# Patient Record
Sex: Male | Born: 1990 | Race: Black or African American | Hispanic: No | Marital: Single | Smoking: Current every day smoker
Health system: Southern US, Community
[De-identification: ages and names within clinical notes are randomized; demographics above are authoritative.]

## PROBLEM LIST (undated history)

## (undated) HISTORY — PX: APPENDECTOMY: SHX54

---

## 2013-11-07 ENCOUNTER — Encounter (HOSPITAL_COMMUNITY): Payer: Self-pay | Admitting: Emergency Medicine

## 2013-11-07 ENCOUNTER — Emergency Department (HOSPITAL_COMMUNITY)
Admission: EM | Admit: 2013-11-07 | Discharge: 2013-11-08 | Disposition: A | Discharge status: Home / Self Care | Payer: Self-pay | Attending: Emergency Medicine | Admitting: Emergency Medicine

## 2013-11-07 ENCOUNTER — Emergency Department (HOSPITAL_COMMUNITY): Payer: Self-pay

## 2013-11-07 DIAGNOSIS — M549 Dorsalgia, unspecified: Secondary | ICD-10-CM | POA: Insufficient documentation

## 2013-11-07 DIAGNOSIS — F17200 Nicotine dependence, unspecified, uncomplicated: Secondary | ICD-10-CM | POA: Insufficient documentation

## 2013-11-07 DIAGNOSIS — R0789 Other chest pain: Secondary | ICD-10-CM | POA: Insufficient documentation

## 2013-11-07 LAB — I-STAT TROPONIN, ED: Troponin i, poc: 0 ng/mL (ref 0.00–0.08)

## 2013-11-07 LAB — CBC
HCT: 39.8 % (ref 39.0–52.0)
HEMOGLOBIN: 13.4 g/dL (ref 13.0–17.0)
MCH: 29.9 pg (ref 26.0–34.0)
MCHC: 33.7 g/dL (ref 30.0–36.0)
MCV: 88.8 fL (ref 78.0–100.0)
PLATELETS: 211 10*3/uL (ref 150–400)
RBC: 4.48 MIL/uL (ref 4.22–5.81)
RDW: 12.8 % (ref 11.5–15.5)
WBC: 11.5 10*3/uL — ABNORMAL HIGH (ref 4.0–10.5)

## 2013-11-07 LAB — BASIC METABOLIC PANEL
BUN: 23 mg/dL (ref 6–23)
CALCIUM: 9.8 mg/dL (ref 8.4–10.5)
CHLORIDE: 104 meq/L (ref 96–112)
CO2: 21 meq/L (ref 19–32)
Creatinine, Ser: 0.82 mg/dL (ref 0.50–1.35)
GFR calc Af Amer: >90 mL/min (ref >90)
GFR calc non Af Amer: >90 mL/min (ref >90)
GLUCOSE: 111 mg/dL — AB (ref 70–99)
POTASSIUM: 3.7 meq/L (ref 3.7–5.3)
Sodium: 138 mEq/L (ref 137–147)

## 2013-11-07 NOTE — ED Provider Notes (Signed)
CSN: [id number]     Arrival date & time 11/07/13  2218 History   First MD Initiated Contact with Patient 11/07/13 2257     Chief Complaint  Patient presents with  . Chest Pain   HPI  History provided by the patient. The patient is a 23 year old male with history of appendectomy and every day smoker who presents with complaints of central chest pain and back pain. [facility] and while he was there was resting talking and suddenly began to have sharp central chest pain occasionally radiating around the shoulders to the back. Pain was most severe for about 10-15 minutes and has eased off since then without any treatment. He does report some return of pain and discomfort when he takes a deep breath or raises his arms up. Patient has been active with no recent long travel. No prior history of DVT or PE. No recent URI symptoms, cough or hemoptysis. He denies any other drug use or alcohol use. No traumas or injuries. No prior history of similar symptoms.   History reviewed. No pertinent past medical history. Past Surgical History  Procedure Laterality Date  . Appendectomy     History reviewed. No pertinent family history. History  Substance Use Topics  . Smoking status: Current Every Day Smoker  . Smokeless tobacco: Never Used  . Alcohol Use: No    Review of Systems  Constitutional: Negative for fever, chills and diaphoresis.  Respiratory: Negative for cough and shortness of breath.   Cardiovascular: Positive for chest pain. Negative for palpitations.  Gastrointestinal: Negative for nausea and vomiting.  All other systems reviewed and are negative.     Allergies  Review of patient's allergies indicates no known allergies.  Home Medications   Prior to Admission medications   Not on File   BP 122/72  Pulse 76  Temp(Src) 97.7 F (36.5 C) (Oral)  Resp 15  SpO2 98% Physical Exam  Nursing note and vitals reviewed. Constitutional: He  appears well-developed and well-nourished. No distress.  HENT:  Head: Normocephalic.  Cardiovascular: Normal rate and regular rhythm.   No murmur heard. Pulmonary/Chest: Effort normal and breath sounds normal. No respiratory distress. He has no wheezes. He has no rales. He exhibits tenderness.    There is some reproducible tenderness primarily to the left sternal edge and pectoralis area. No masses swelling or deformities. This.  Abdominal: Soft. There is no tenderness. There is no rebound and no guarding.  Skin: Skin is warm.  Psychiatric: He has a normal mood and affect. His behavior is normal.    ED Course  Procedures   COORDINATION OF CARE:  Nursing notes reviewed. Vital signs reviewed. Initial pt interview and examination performed.   Filed Vitals:   11/07/13 2220  BP: 122/72  Pulse: 76  Temp: 97.7 F (36.5 C)  TempSrc: Oral  Resp: 15  SpO2: 98%    11:06 PM-patient seen and evaluated. Patient sitting appears comfortable no acute distress. Afebrile. Normal heart rate. Normal respirations and O2 sats. Patient is PERC negative. No significant history of risk factors for ACS.  Patient with unremarkable laboratory testing and chest x-ray. Normal EKG. At this time do not suspect any concerning or emergent condition.  The patient appears reasonably screened and/or stabilized for discharge and I doubt any other medical condition or other [facility] requiring further screening, evaluation, or treatment in the ED at this time prior to discharge.     Treatment plan initiated: Medications  naproxen (NAPROSYN)  tablet 500 mg (not administered)    Results for orders placed during the hospital encounter of 11/07/13  CBC      Result Value Ref Range   WBC 11.5 (*) 4.0 - 10.5 K/uL   RBC 4.48  4.22 - 5.81 MIL/uL   Hemoglobin 13.4  13.0 - 17.0 g/dL   HCT 45.439.8  09.839.0 - 11.952.0 %   MCV 88.8  78.0 - 100.0 fL   MCH 29.9  26.0 - 34.0 pg   MCHC 33.7  30.0 - 36.0 g/dL   RDW 14.712.8  82.911.5 - 56.215.5 %     Platelets 211  150 - 400 K/uL  BASIC METABOLIC PANEL      Result Value Ref Range   Sodium 138  137 - 147 mEq/L   Potassium 3.7  3.7 - 5.3 mEq/L   Chloride 104  96 - 112 mEq/L   CO2 21  19 - 32 mEq/L   Glucose, Bld 111 (*) 70 - 99 mg/dL   BUN 23  6 - 23 mg/dL   Creatinine, Ser 1.300.82  0.50 - 1.35 mg/dL   Calcium 9.8  8.4 - 86.510.5 mg/dL   GFR calc non Af Amer >90  >90 mL/min   GFR calc Af Amer >90  >90 mL/min  I-STAT TROPOININ, ED      Result Value Ref Range   Troponin i, poc 0.00  0.00 - 0.08 ng/mL   Comment 3               Imaging Review Dg Chest Port 1 View  11/07/2013   CLINICAL DATA:  Chest pain.  Back pain.  Short of breath.  EXAM: PORTABLE CHEST - 1 VIEW  COMPARISON:  None.  FINDINGS: Cardiopericardial silhouette within normal limits. Mediastinal contours normal. Trachea midline. No airspace disease or effusion. Monitoring leads project over the chest.  IMPRESSION: No active disease.   Electronically Signed   By: Andreas NewportGeoffrey  Lamke M.D.   On: 11/07/2013 23:01     EKG Interpretation None      Date: 11/08/2013  Rate: 78  Rhythm: normal sinus rhythm  QRS Axis: normal  Intervals: normal  ST/T Wave abnormalities: normal  Conduction Disutrbances:none  Narrative Interpretation:   Old EKG Reviewed: none available    MDM   Final diagnoses:  Musculoskeletal chest pain        Angus Sellereter S Dammen, PA-C 11/08/13 0011

## 2013-11-07 NOTE — ED Notes (Signed)
Bed: WA20 Expected date:  Expected time:  Means of arrival:  Comments: EMS back and chest wall pain

## 2013-11-07 NOTE — ED Notes (Signed)
Xray bedside.

## 2013-11-07 NOTE — ED Notes (Signed)
[facility].  Pt reports that he has been having sharp pain to chest and back 10-15 minutes ago; pt reports increased pain with movement of arms and with breathing.  Pt presents to ED ambulatory, skin warm and dry, no s/s acute distress noted, no s/s respiratory distress.

## 2013-11-08 MED ORDER — NAPROXEN 500 MG PO TABS: 500.0000 mg | ORAL_TABLET | Freq: Two times a day (BID) | ORAL | Status: DC

## 2013-11-08 MED ORDER — NAPROXEN 500 MG PO TABS
500.0000 mg | ORAL_TABLET | Freq: Once | ORAL | Status: AC
  Administered 2013-11-08: 500 mg via ORAL
  Filled 2013-11-08: qty 1

## 2013-11-08 NOTE — ED Provider Notes (Signed)
Medical screening examination/treatment/procedure(s) were performed by non-physician practitioner and as supervising physician I was immediately available for consultation/collaboration.   EKG Interpretation None        Trichelle Lehan, MD 11/08/13 0337 

## 2013-11-08 NOTE — Discharge Instructions (Signed)
You were seen and evaluated for your chest pains. At this time your providers do not feel your chest pains are caused by any emergent condition. Please use the medication prescribed to help with pain and soreness. Followup with a primary care provider. Return anytime for change or worsening symptoms.    Chest Wall Pain Chest wall pain is pain in or around the bones and muscles of your chest. It may take up to 6 weeks to get better. It may take longer if you must stay physically active in your work and activities.  CAUSES  Chest wall pain may happen on its own. However, it may be caused by:  A viral illness like the flu.  Injury.  Coughing.  Exercise.  Arthritis.  Fibromyalgia.  Shingles. HOME CARE INSTRUCTIONS   Avoid overtiring physical activity. Try not to strain or perform activities that cause pain. This includes any activities using your chest or your abdominal and side muscles, especially if heavy weights are used.  Put ice on the sore area.  Put ice in a plastic bag.  Place a towel between your skin and the bag.  Leave the ice on for 15-20 minutes per hour while awake for the first 2 days.  Only take over-the-counter or prescription medicines for pain, discomfort, or fever as directed by your caregiver. SEEK IMMEDIATE MEDICAL CARE IF:   Your pain increases, or you are very uncomfortable.  You have a fever.  Your chest pain becomes worse.  You have new, unexplained symptoms.  You have nausea or vomiting.  You feel sweaty or lightheaded.  You have a cough with phlegm (sputum), or you cough up blood. MAKE SURE YOU:   Understand these instructions.  Will watch your condition.  Will get help right away if you are not doing well or get worse. Document Released: 05/11/2005 Document Revised: 08/03/2011 Document Reviewed: 01/05/2011 [facility] Patient Information 2015 [place], [place]. This information is not intended to replace advice given to you by your health  care provider. Make sure you discuss any questions you have with your health care provider.

## 2013-11-16 ENCOUNTER — Ambulatory Visit (HOSPITAL_COMMUNITY)
Admission: RE | Admit: 2013-11-16 | Discharge: 2013-11-16 | Disposition: A | Discharge status: Home / Self Care | Payer: Self-pay | Source: Ambulatory Visit | Attending: Family Medicine | Admitting: Family Medicine

## 2013-11-16 ENCOUNTER — Other Ambulatory Visit (HOSPITAL_COMMUNITY): Payer: Self-pay | Admitting: Family Medicine

## 2013-11-16 DIAGNOSIS — R05 Cough: Secondary | ICD-10-CM | POA: Insufficient documentation

## 2013-11-16 DIAGNOSIS — M795 Residual foreign body in soft tissue: Secondary | ICD-10-CM | POA: Insufficient documentation

## 2013-11-16 DIAGNOSIS — R059 Cough, unspecified: Secondary | ICD-10-CM | POA: Insufficient documentation

## 2013-12-19 ENCOUNTER — Emergency Department (HOSPITAL_COMMUNITY): Payer: Self-pay

## 2013-12-19 ENCOUNTER — Emergency Department (HOSPITAL_COMMUNITY)
Admission: EM | Admit: 2013-12-19 | Discharge: 2013-12-19 | Disposition: A | Discharge status: Home / Self Care | Payer: Self-pay | Attending: Emergency Medicine | Admitting: Emergency Medicine

## 2013-12-19 ENCOUNTER — Encounter (HOSPITAL_COMMUNITY): Payer: Self-pay | Admitting: Emergency Medicine

## 2013-12-19 DIAGNOSIS — R079 Chest pain, unspecified: Secondary | ICD-10-CM | POA: Insufficient documentation

## 2013-12-19 DIAGNOSIS — J40 Bronchitis, not specified as acute or chronic: Secondary | ICD-10-CM

## 2013-12-19 DIAGNOSIS — IMO0002 Reserved for concepts with insufficient information to code with codable children: Secondary | ICD-10-CM | POA: Insufficient documentation

## 2013-12-19 DIAGNOSIS — F17200 Nicotine dependence, unspecified, uncomplicated: Secondary | ICD-10-CM | POA: Insufficient documentation

## 2013-12-19 DIAGNOSIS — Z720 Tobacco use: Secondary | ICD-10-CM

## 2013-12-19 DIAGNOSIS — Z79899 Other long term (current) drug therapy: Secondary | ICD-10-CM | POA: Insufficient documentation

## 2013-12-19 LAB — CBC
HEMATOCRIT: 43.2 % (ref 39.0–52.0)
Hemoglobin: 14.1 g/dL (ref 13.0–17.0)
MCH: 29.6 pg (ref 26.0–34.0)
MCHC: 32.6 g/dL (ref 30.0–36.0)
MCV: 90.6 fL (ref 78.0–100.0)
Platelets: 204 10*3/uL (ref 150–400)
RBC: 4.77 MIL/uL (ref 4.22–5.81)
RDW: 13.1 % (ref 11.5–15.5)
WBC: 7.6 10*3/uL (ref 4.0–10.5)

## 2013-12-19 LAB — I-STAT TROPONIN, ED: TROPONIN I, POC: 0 ng/mL (ref 0.00–0.08)

## 2013-12-19 LAB — BASIC METABOLIC PANEL
ANION GAP: 14 (ref 5–15)
BUN: 18 mg/dL (ref 6–23)
CO2: 21 mEq/L (ref 19–32)
Calcium: 9.6 mg/dL (ref 8.4–10.5)
Chloride: 101 mEq/L (ref 96–112)
Creatinine, Ser: 0.8 mg/dL (ref 0.50–1.35)
GFR calc Af Amer: >90 mL/min (ref >90)
Glucose, Bld: 95 mg/dL (ref 70–99)
POTASSIUM: 4 meq/L (ref 3.7–5.3)
Sodium: 136 mEq/L — ABNORMAL LOW (ref 137–147)

## 2013-12-19 LAB — PRO B NATRIURETIC PEPTIDE: PRO B NATRI PEPTIDE: 7.4 pg/mL (ref 0–125)

## 2013-12-19 MED ORDER — AEROCHAMBER Z-STAT PLUS/MEDIUM MISC
1.0000 | Freq: Once | Status: AC
  Administered 2013-12-19: 1
  Filled 2013-12-19: qty 1

## 2013-12-19 MED ORDER — ALBUTEROL SULFATE HFA 108 (90 BASE) MCG/ACT IN AERS
2.0000 | INHALATION_SPRAY | RESPIRATORY_TRACT | Status: DC | PRN
  Administered 2013-12-19: 2 via RESPIRATORY_TRACT
  Filled 2013-12-19: qty 6.7

## 2013-12-19 MED ORDER — IPRATROPIUM-ALBUTEROL 0.5-2.5 (3) MG/3ML IN SOLN
3.0000 mL | Freq: Once | RESPIRATORY_TRACT | Status: AC
Start: 2013-12-19 — End: 2013-12-19
  Administered 2013-12-19: 3 mL via RESPIRATORY_TRACT
  Filled 2013-12-19: qty 3

## 2013-12-19 MED ORDER — PREDNISONE 20 MG PO TABS
60.0000 mg | ORAL_TABLET | Freq: Once | ORAL | Status: AC
  Administered 2013-12-19: 60 mg via ORAL
  Filled 2013-12-19: qty 3

## 2013-12-19 MED ORDER — PREDNISONE 20 MG PO TABS: 20.0000 mg | ORAL_TABLET | Freq: Two times a day (BID) | ORAL | Status: AC

## 2013-12-19 NOTE — Discharge Instructions (Signed)
Try to stop smoking. Use Tylenol for pain. Use the inhaler 2 puffs every 4 hours for trouble breathing or cough.    Smoking Hazards Smoking cigarettes is extremely bad for your health. Tobacco smoke has over 200 known poisons in it. It contains the poisonous gases nitrogen oxide and carbon monoxide. There are over 60 chemicals in tobacco smoke that cause cancer. Some of the chemicals found in cigarette smoke include:   Cyanide.   Benzene.   Formaldehyde.   Methanol (wood alcohol).   Acetylene (fuel used in welding torches).   Ammonia.  Even smoking lightly shortens your life expectancy by several years. You can greatly reduce the risk of medical problems for you and your family by stopping now. Smoking is the most preventable cause of death and disease in our society. Within days of quitting smoking, your circulation improves, you decrease the risk of having a heart attack, and your lung capacity improves. There may be some increased phlegm in the first few days after quitting, and it may take months for your lungs to clear up completely. Quitting for 10 years reduces your risk of developing lung cancer to almost that of a nonsmoker.  WHAT ARE THE RISKS OF SMOKING? Cigarette smokers have an increased risk of many serious medical problems, including:  Lung cancer.   Lung disease (such as pneumonia, bronchitis, and emphysema).   Heart attack and chest pain due to the heart not getting enough oxygen (angina).   Heart disease and peripheral blood vessel disease.   Hypertension.   Stroke.   Oral cancer (cancer of the lip, mouth, or voice box).   Bladder cancer.   Pancreatic cancer.   Cervical cancer.   Pregnancy complications, including premature birth.   Stillbirths and smaller newborn babies, birth defects, and genetic damage to sperm.   Early menopause.   Lower estrogen level for women.   Infertility.   Facial wrinkles.   Blindness.    Increased risk of broken bones (fractures).   Senile dementia.   Stomach ulcers and internal bleeding.   Delayed wound healing and increased risk of complications during surgery. Because of secondhand smoke exposure, children of smokers have an increased risk of the following:   Sudden infant death syndrome (SIDS).   Respiratory infections.   Lung cancer.   Heart disease.   Ear infections.  WHY IS SMOKING ADDICTIVE? Nicotine is the chemical agent in tobacco that is capable of causing addiction or dependence. When you smoke and inhale, nicotine is absorbed rapidly into the bloodstream through your lungs. Both inhaled and noninhaled nicotine may be addictive.  WHAT ARE THE BENEFITS OF QUITTING?  There are many health benefits to quitting smoking. Some are:   The likelihood of developing cancer and heart disease decreases. Health improvements are seen almost immediately.   Blood pressure, pulse rate, and breathing patterns start returning to normal soon after quitting.   People who quit may see an improvement in their overall quality of life.  HOW DO YOU QUIT SMOKING? Smoking is an addiction with both physical and psychological effects, and longtime habits can be hard to change. Your health care provider can recommend:  Programs and community resources, which may include group support, education, or therapy.  Replacement products, such as patches, gum, and nasal sprays. Use these products only as directed. Do not replace cigarette smoking with electronic cigarettes (commonly called e-cigarettes). The safety of e-cigarettes is unknown, and some may contain harmful chemicals. FOR MORE INFORMATION  American Lung Association:  www.lung.org  American Cancer Society: www.cancer.org Document Released: 06/18/2004 Document Revised: 03/01/2013 Document Reviewed: 10/31/2012 [facility] Patient Information 2015 [place], [place]. This information is not intended to replace advice  given to you by your health care provider. Make sure you discuss any questions you have with your health care provider. Acute Bronchitis Bronchitis is inflammation of the airways that extend from the windpipe into the lungs (bronchi). The inflammation often causes mucus to develop. This leads to a cough, which is the most common symptom of bronchitis.  In acute bronchitis, the condition usually develops suddenly and goes away over time, usually in a couple weeks. Smoking, allergies, and asthma can make bronchitis worse. Repeated episodes of bronchitis may cause further lung problems.  CAUSES Acute bronchitis is most often caused by the same virus that causes a cold. The virus can spread from person to person (contagious) through coughing, sneezing, and touching contaminated objects. SIGNS AND SYMPTOMS   Cough.   Fever.   Coughing up mucus.   Body aches.   Chest congestion.   Chills.   Shortness of breath.   Sore throat.  DIAGNOSIS  Acute bronchitis is usually diagnosed through a physical exam. Your health care provider will also ask you questions about your medical history. Tests, such as chest X-rays, are sometimes done to rule out other conditions.  TREATMENT  Acute bronchitis usually goes away in a couple weeks. Oftentimes, no medical treatment is necessary. Medicines are sometimes given for relief of fever or cough. Antibiotic medicines are usually not needed but may be prescribed in certain situations. In some cases, an inhaler may be recommended to help reduce shortness of breath and control the cough. A cool mist vaporizer may also be used to help thin bronchial secretions and make it easier to clear the chest.  HOME CARE INSTRUCTIONS  Get plenty of rest.   Drink enough fluids to keep your urine clear or pale yellow (unless you have a medical condition that requires fluid restriction). Increasing fluids may help thin your respiratory secretions (sputum) and reduce chest  congestion, and it will prevent dehydration.   Take medicines only as directed by your health care provider.  If you were prescribed an antibiotic medicine, finish it all even if you start to feel better.  Avoid smoking and secondhand smoke. Exposure to cigarette smoke or irritating chemicals will make bronchitis worse. If you are a smoker, consider using nicotine gum or skin patches to help control withdrawal symptoms. Quitting smoking will help your lungs heal faster.   Reduce the chances of another bout of acute bronchitis by washing your hands frequently, avoiding people with cold symptoms, and trying not to touch your hands to your mouth, nose, or eyes.   Keep all follow-up visits as directed by your health care provider.  SEEK MEDICAL CARE IF: Your symptoms do not improve after 1 week of treatment.  SEEK IMMEDIATE MEDICAL CARE IF:  You develop an increased fever or chills.   You have chest pain.   You have severe shortness of breath.  You have bloody sputum.   You develop dehydration.  You faint or repeatedly feel like you are going to pass out.  You develop repeated vomiting.  You develop a severe headache. MAKE SURE YOU:   Understand these instructions.  Will watch your condition.  Will get help right away if you are not doing well or get worse. Document Released: 06/18/2004 Document Revised: 09/25/2013 Document Reviewed: 11/01/2012 [facility] Patient Information 2015 [place], [place]. This information  is not intended to replace advice given to you by your health care provider. Make sure you discuss any questions you have with your health care provider.

## 2013-12-19 NOTE — ED Provider Notes (Signed)
CSN: [id number]     Arrival date & time 12/19/13  1003 History   First MD Initiated Contact with Patient 12/19/13 1131     Chief Complaint  Patient presents with  . Chest Pain     (Consider location/radiation/quality/duration/timing/severity/associated sxs/prior Treatment) HPI  Mark Hurley is a 23 y.o. male presents for evaluation of intermittent chest pain, for 1 month. It has been present since he was evaluated for same one month ago. He has cough, productive of "phlegm". He denies fever or chills, nausea, vomiting, weakness, or dizziness. He occasionally has shortness of breath with ambulation. He is not currently taking any medications. He continues to smoke tobacco. There are no other known modifying factors  History reviewed. No pertinent past medical history. Past Surgical History  Procedure Laterality Date  . Appendectomy     No family history on file. History  Substance Use Topics  . Smoking status: Current Every Day Smoker  . Smokeless tobacco: Never Used  . Alcohol Use: No    Review of Systems  All other systems reviewed and are negative.     Allergies  Review of patient's allergies indicates no known allergies.  Home Medications   Prior to Admission medications   Medication Sig Start Date End Date Taking? Authorizing Provider  acetaminophen (TYLENOL) 500 MG tablet Take 1,000 mg by mouth every 6 (six) hours as needed for moderate pain.   Yes Historical Provider, MD  predniSONE (DELTASONE) 20 MG tablet Take 1 tablet (20 mg total) by mouth 2 (two) times daily. 12/19/13   Flint Melter, MD   BP 110/64  Pulse 67  Temp(Src) 97.7 F (36.5 C) (Oral)  Resp 17  SpO2 100% Physical Exam  Nursing note and vitals reviewed. Constitutional: He is oriented to person, place, and time. He appears well-developed and well-nourished.  HENT:  Head: Normocephalic and atraumatic.  Right Ear: External ear normal.  Left Ear: External ear normal.  Eyes: Conjunctivae and  EOM are normal. Pupils are equal, round, and reactive to light.  Neck: Normal range of motion and phonation normal. Neck supple.  Cardiovascular: Normal rate, regular rhythm, normal heart sounds and intact distal pulses.   Pulmonary/Chest: Effort normal and breath sounds normal. No respiratory distress. He has no wheezes. He exhibits no bony tenderness.  Somewhat prolonged expirations  Abdominal: Soft. There is no tenderness.  Musculoskeletal: Normal range of motion.  Neurological: He is alert and oriented to person, place, and time. No cranial nerve deficit or sensory deficit. He exhibits normal muscle tone. Coordination normal.  Skin: Skin is warm, dry and intact.  Psychiatric: He has a normal mood and affect. His behavior is normal. Judgment and thought content normal.    ED Course  Procedures (including critical care time)  Medications  albuterol (PROVENTIL HFA;VENTOLIN HFA) 108 (90 BASE) MCG/ACT inhaler 2 puff (2 puffs Inhalation Given 12/19/13 1329)  predniSONE (DELTASONE) tablet 60 mg (60 mg Oral Given 12/19/13 1215)  ipratropium-albuterol (DUONEB) 0.5-2.5 (3) MG/3ML nebulizer solution 3 mL (3 mLs Nebulization Given 12/19/13 1216)  aerochamber Z-Stat Plus/medium 1 each (1 each Other Given 12/19/13 1329)    Patient Vitals for the past 24 hrs:  BP Temp Temp src Pulse Resp SpO2  12/19/13 1315 - - - 67 17 100 %  12/19/13 1130 - 97.7 F (36.5 C) Oral - - -  12/19/13 1013 110/64 mmHg 98.1 F (36.7 C) Oral 77 18 99 %    At D/C Reevaluation with update and discussion. After initial assessment  and treatment, an updated evaluation reveals he feels better. Findings discussed with patient. All questions answered. Mark Hurley L    Labs Review Labs Reviewed  BASIC METABOLIC PANEL - Abnormal; Notable for the following:    Sodium 136 (*)    All other components within normal limits  CBC  PRO B NATRIURETIC PEPTIDE  I-STAT TROPOININ, ED    Imaging Review Dg Chest 2 View  12/19/2013    CLINICAL DATA:  Chest pain, shortness of Breath  EXAM: CHEST  2 VIEW  COMPARISON:  11/16/2013  FINDINGS: Cardiomediastinal silhouette is stable. No acute infiltrate or pleural effusion. No pulmonary edema. Bony thorax is unremarkable.  IMPRESSION: No active cardiopulmonary disease.   Electronically Signed   By: Natasha MeadLiviu  Pop M.D.   On: 12/19/2013 12:48     EKG Interpretation None       Date: today, MUSE link inoperative  Rate: 70  Rhythm: normal sinus rhythm  QRS Axis: normal  PR and QT Intervals: normal  ST/T Wave abnormalities: nonspecific ST changes  PR and QRS Conduction Disutrbances:none  Narrative Interpretation:   Old EKG Reviewed: none available    MDM   Final diagnoses:  Bronchitis  Tobacco abuse    Evaluation consistent with bronchitis related to tobacco abuse. No evidence for pneumonia, ACS, or metabolic instability.  Nursing Notes Reviewed/ Care Coordinated Applicable Imaging Reviewed Interpretation of Laboratory Data incorporated into ED treatment  The patient appears reasonably screened and/or stabilized for discharge and I doubt any other medical condition or other [facility] requiring further screening, evaluation, or treatment in the ED at this time prior to discharge.  Plan: Home Medications- Prednisone, Albuterol; Home Treatments- rest, stop smoking; return here if the recommended treatment, does not improve the symptoms; Recommended follow up- PCP of choice prn    Flint MelterElliott L Malakai Schoenherr, MD 12/19/13 1556

## 2013-12-19 NOTE — Progress Notes (Signed)
  CARE MANAGEMENT ED NOTE 12/19/2013  Patient:  Mark Hurley,Mark Hurley   Account Number:  [id number]  Date Initiated:  12/19/2013  Documentation initiated by:  Edd ArbourGIBBS,Jiali Linney  Subjective/Objective Assessment:   23 yr old self pay pt [place] States he was initially homeless but is now staying with his brother     Subjective/Objective Assessment Detail:   c/o chest pain x 1 month, has been seen for this before but states he was sent home. Pt also states he is having SOB and n/v.  Pt agreed to have [facility] staff [facility]     Action/Plan:   ED CM discussed self pay resources CM updated EPIC address information for pt provided [facility] to [facility] staff CM reviewed self pay resources See note below Discussed difference in EDP vs pcps   Action/Plan Detail:   Anticipated DC Date:  12/19/2013     Status Recommendation to Physician:   Result of Recommendation:    Other ED Services  Consult Working Plan    DC Planning Services  Other  PCP issues  Outpatient Services - Pt will follow up  [facility] / [facility] (established/new)  [facility] / [facility] (established/new)    Choice offered to / List presented to:            Status of service:  Completed, signed off  ED Comments:   ED Comments Detail:  CM spoke with pt who [place]. CM discussed and provided written information for self pay pcps, importance of pcp for f/u care, www.needymeds.org, discounted pharmacies and other [place] resources such as financial assistance, DSS and  health department  Reviewed resources for [place] pcps like [facility], [facility] [address], [facility], general medical clinics, [facility] plus others, [facility] out patient pharmacies and housing Pt voiced understanding and appreciation of resources provided  Provided [facility] contact information

## 2013-12-19 NOTE — ED Notes (Signed)
Pt c/o chest pain x 1 month, has been seen for this before but states he was sent home. Pt also states he is having SOB and n/v.
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
## 2015-12-14 IMAGING — CR DG CHEST 2V
2 series · 2 of 2 positions shown · non-contrast
Comparison: 11/16/2013

CLINICAL DATA: Chest pain, shortness of Breath

EXAM:
CHEST  2 VIEW

[w chest pa]
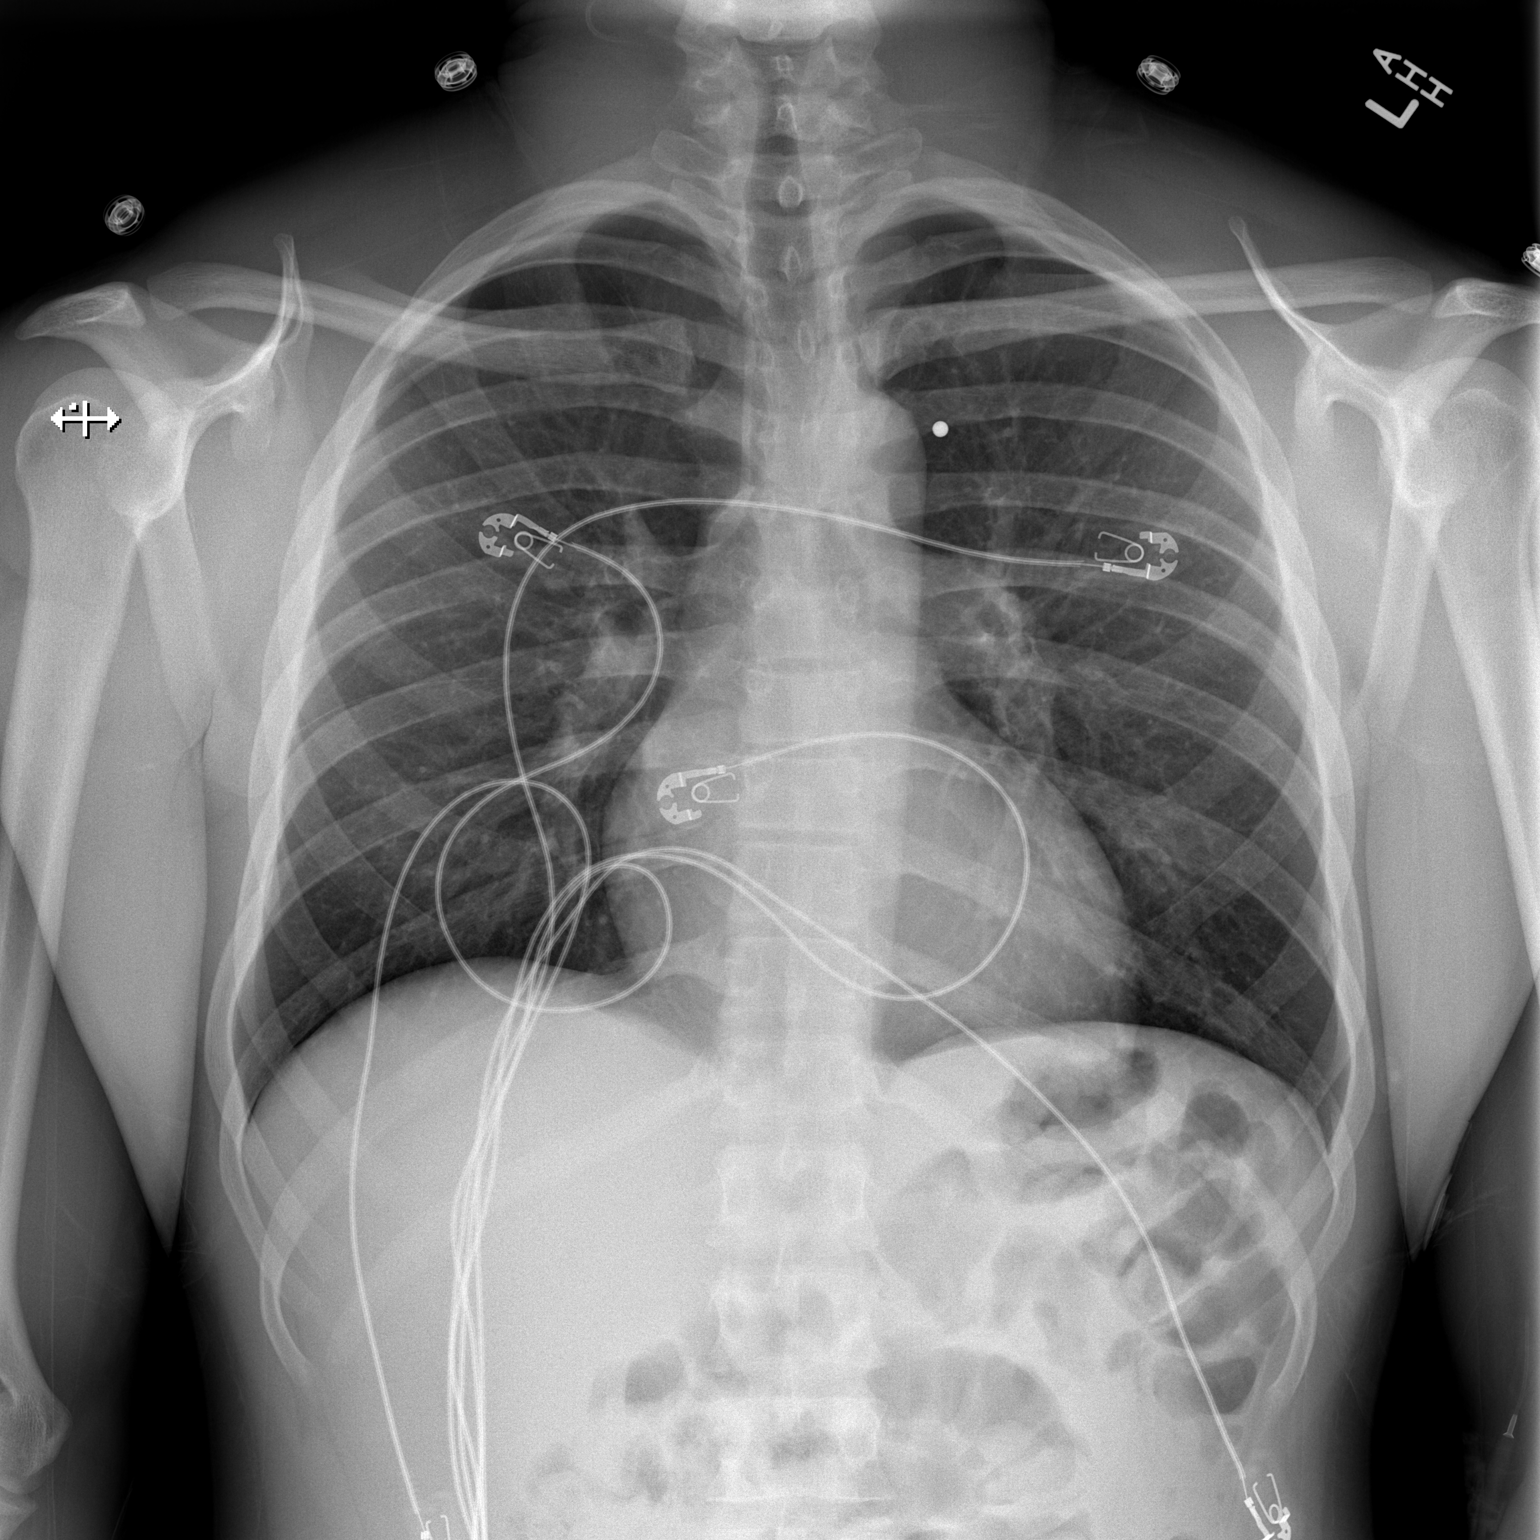

[w chest lat]
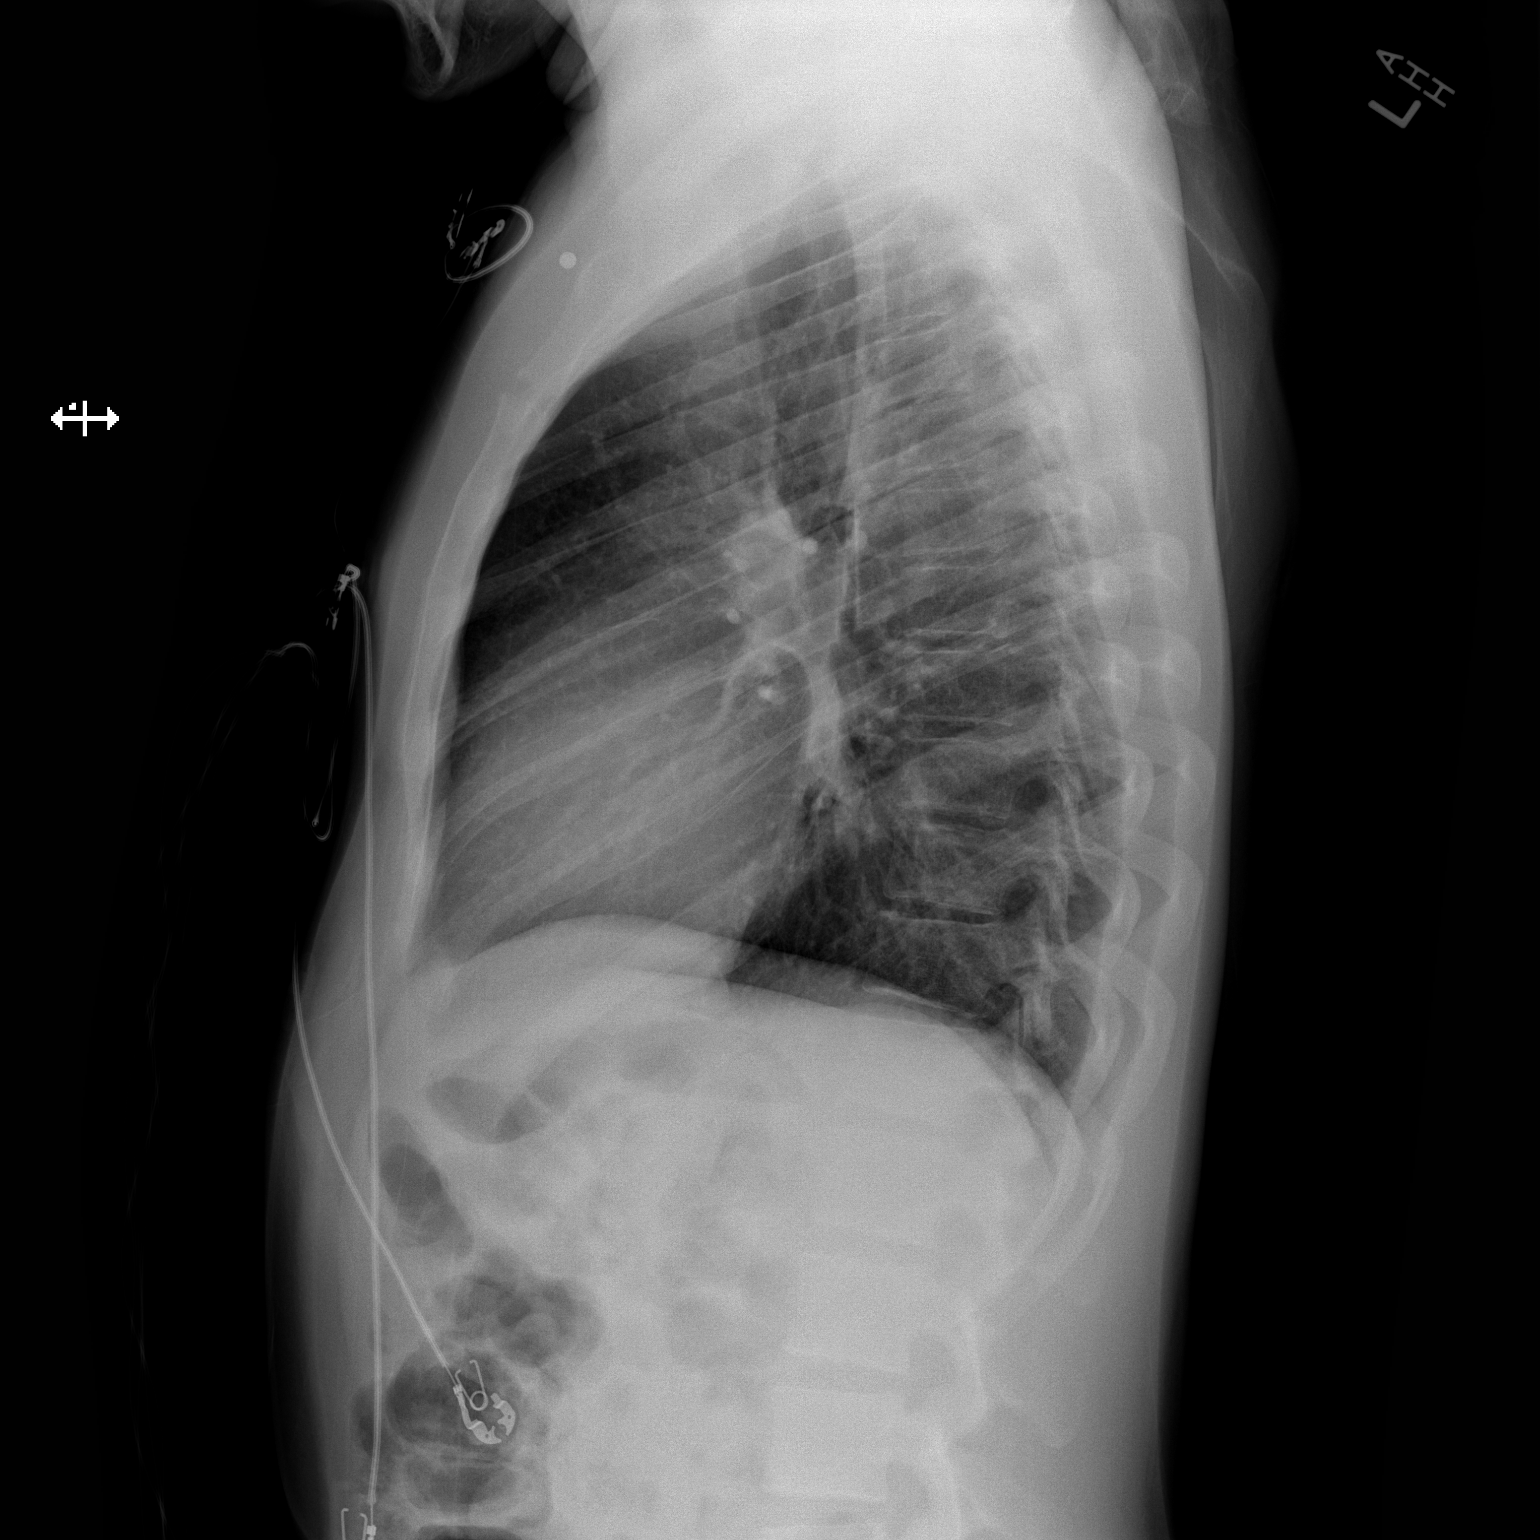

[2 of 2 positions shown; findings below may reference images not displayed]

FINDINGS: Cardiomediastinal silhouette is stable. No acute infiltrate or
pleural effusion. No pulmonary edema. Bony thorax is unremarkable.
IMPRESSION: No active cardiopulmonary disease.
